# Patient Record
Sex: Female | Born: 2000 | Race: Black or African American | Hispanic: No | Marital: Single | Smoking: Current every day smoker
Health system: Southern US, Community
[De-identification: ages and names within clinical notes are randomized; demographics above are authoritative.]

## PROBLEM LIST (undated history)

## (undated) ENCOUNTER — Inpatient Hospital Stay: Payer: Self-pay

## (undated) DIAGNOSIS — Z789 Other specified health status: Secondary | ICD-10-CM

---
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
## 2019-04-18 ENCOUNTER — Other Ambulatory Visit: Payer: Self-pay

## 2019-04-18 ENCOUNTER — Encounter: Payer: Self-pay | Admitting: Emergency Medicine

## 2019-04-18 ENCOUNTER — Emergency Department: Payer: Self-pay

## 2019-04-18 ENCOUNTER — Emergency Department
Admission: EM | Admit: 2019-04-18 | Discharge: 2019-04-18 | Disposition: A | Discharge status: Home / Self Care | Payer: Self-pay | Attending: Emergency Medicine | Admitting: Emergency Medicine

## 2019-04-18 DIAGNOSIS — Y929 Unspecified place or not applicable: Secondary | ICD-10-CM | POA: Insufficient documentation

## 2019-04-18 DIAGNOSIS — S51832A Puncture wound without foreign body of left forearm, initial encounter: Secondary | ICD-10-CM | POA: Insufficient documentation

## 2019-04-18 DIAGNOSIS — Y939 Activity, unspecified: Secondary | ICD-10-CM | POA: Insufficient documentation

## 2019-04-18 DIAGNOSIS — Y999 Unspecified external cause status: Secondary | ICD-10-CM | POA: Insufficient documentation

## 2019-04-18 DIAGNOSIS — S59912A Unspecified injury of left forearm, initial encounter: Secondary | ICD-10-CM | POA: Insufficient documentation

## 2019-04-18 MED ORDER — BACITRACIN ZINC 500 UNIT/GM EX OINT
TOPICAL_OINTMENT | Freq: Once | CUTANEOUS | Status: AC
Start: 2019-04-18 — End: 2019-04-18
  Administered 2019-04-18: 22:00:00 via TOPICAL
  Filled 2019-04-18: qty 0.9

## 2019-04-18 MED ORDER — SULFAMETHOXAZOLE-TRIMETHOPRIM 800-160 MG PO TABS: 1.0000 | ORAL_TABLET | Freq: Two times a day (BID) | ORAL | 0 refills | Status: AC
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
## 2019-04-18 NOTE — ED Notes (Signed)
Left forearm cleansed with sterile water and bacitracin placed on would.  Sling to left arm.

## 2019-04-18 NOTE — Discharge Instructions (Addendum)
Please seek medical attention for any high fevers, chest pain, shortness of breath, change in behavior, persistent vomiting, bloody stool or any other new or concerning symptoms.  
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
## 2019-04-18 NOTE — ED Notes (Signed)
Patient no longer has a legal guardian as she is of age at this time.

## 2019-04-18 NOTE — ED Notes (Signed)
Pt sitting out front to get warm; ambulated with steady gait; talking in complete coherent sentences;

## 2021-03-03 ENCOUNTER — Encounter: Payer: Self-pay | Admitting: Advanced Practice Midwife

## 2021-03-03 ENCOUNTER — Other Ambulatory Visit: Payer: Self-pay

## 2021-03-03 ENCOUNTER — Ambulatory Visit: Payer: Self-pay | Admitting: Advanced Practice Midwife

## 2021-03-03 DIAGNOSIS — Z113 Encounter for screening for infections with a predominantly sexual mode of transmission: Secondary | ICD-10-CM

## 2021-03-03 DIAGNOSIS — F1290 Cannabis use, unspecified, uncomplicated: Secondary | ICD-10-CM

## 2021-03-03 DIAGNOSIS — B9689 Other specified bacterial agents as the cause of diseases classified elsewhere: Secondary | ICD-10-CM

## 2021-03-03 DIAGNOSIS — Z720 Tobacco use: Secondary | ICD-10-CM

## 2021-03-03 DIAGNOSIS — N760 Acute vaginitis: Secondary | ICD-10-CM

## 2021-03-03 LAB — WET PREP FOR TRICH, YEAST, CLUE
Trichomonas Exam: NEGATIVE
Yeast Exam: NEGATIVE

## 2021-03-03 MED ORDER — METRONIDAZOLE 500 MG PO TABS: 500.0000 mg | ORAL_TABLET | Freq: Two times a day (BID) | ORAL | 0 refills | Status: AC

## 2021-03-03 NOTE — Progress Notes (Signed)
Pt here for STD screening.  Wet mount results reviewed, medication dispensed per Provider orders.  Pt given condoms. Berdie Ogren, RN

## 2021-03-03 NOTE — Progress Notes (Signed)
[facility] Department STI clinic/screening visit  Subjective:  Brianna Ayala is a 20 y.o. SBF G2P2 vaper female being seen today for an STI screening visit. The patient reports they do have symptoms.  Patient reports that they do not desire a pregnancy in the next year.   They reported they are not interested in discussing contraception today.  No LMP recorded.   Patient has the following medical conditions:  There are no problems to display for this patient.   Chief Complaint  Patient presents with   SEXUALLY TRANSMITTED DISEASE    HPI  Patient reports external irritation since yesterday with red vagina last night. Poor historian and finally states she has a clear liquid coming out of 2 areas since yesterday (mons and left inguinal fold). Upon questioning, pt states her partner is asymptomatic.  Last sex 02/27/21 without condom; with current partner x5 mo. LMP 02/07/21. Last MJ last night. Last cigar 2 wks ago. Current vaper. Last ETOH 02/14/21 (2 glasses wine) qo weekend.  Last HIV test per patient/review of record was 2022 Patient reports last pap was never  See flowsheet for further details and programmatic requirements.    The following portions of the patient's history were reviewed and updated as appropriate: allergies, current medications, past medical history, past social history, past surgical history and problem list.  Objective:  There were no vitals filed for this visit.  Physical Exam Vitals and nursing note reviewed.  Constitutional:      Appearance: Normal appearance. She is normal weight.  HENT:     Head: Normocephalic and atraumatic.     Mouth/Throat:     Mouth: Mucous membranes are moist.     Pharynx: Oropharynx is clear. No oropharyngeal exudate or posterior oropharyngeal erythema.  Eyes:     Conjunctiva/sclera: Conjunctivae normal.  Pulmonary:     Effort: Pulmonary effort is normal.  Chest:  Breasts:    Right: No axillary adenopathy or  supraclavicular adenopathy.     Left: No axillary adenopathy or supraclavicular adenopathy.  Abdominal:     General: Abdomen is flat.     Palpations: Abdomen is soft. There is no mass.     Tenderness: There is no abdominal tenderness. There is no rebound.     Comments: Soft without masses or tenderness  Genitourinary:    Exam position: Lithotomy position.     Pubic Area: No rash or pubic lice.      Labia:        Right: No rash or lesion.        Left: Tenderness (area on left inguinal fold tender to touch with clear liquid) present. No rash or lesion.      Vagina: Vaginal discharge (white thick leukorrhea, ph>4.5) and erythema (sl erythematous vagina) present. No bleeding or lesions.     Cervix: Normal.     Uterus: Normal.      Adnexa: Right adnexa normal and left adnexa normal.     Rectum: Normal.       Comments: Left inguinal fold with clear moist skin greyish with breakdown; no distinct ulceration Right mons with clear moist skin with no distinct ulceration. Very tender to touch. Shoddy inguinal lymphadenopathy HSV cultures done of both and of cx Lymphadenopathy:     Head:     Right side of head: No preauricular or posterior auricular adenopathy.     Left side of head: No preauricular or posterior auricular adenopathy.     Cervical: No cervical adenopathy.  Right cervical: No superficial, deep or posterior cervical adenopathy.    Left cervical: No superficial, deep or posterior cervical adenopathy.     Upper Body:     Right upper body: No supraclavicular or axillary adenopathy.     Left upper body: No supraclavicular or axillary adenopathy.     Lower Body: Right inguinal adenopathy present. Left inguinal adenopathy present.  Skin:    General: Skin is warm and dry.     Findings: No rash.  Neurological:     Mental Status: She is alert and oriented to person, place, and time.     Assessment and Plan:  Brianna Ayala is a 20 y.o. female presenting to the [facility] Department for STI screening  1. Screening examination for venereal disease Treat wet mount per standing orders Immunization nurse consult - WET PREP FOR TRICH, YEAST, CLUE - Chlamydia/Gonorrhea [facility] - HIV/HCV [facility] - Syphilis Serology, [facility] - HSV Culture, No Typing     No follow-ups on file.  No future appointments.  Alberteen Spindle, CNM

## 2021-03-09 LAB — HM HIV SCREENING LAB: HM HIV Screening: NEGATIVE
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
## 2022-04-24 ENCOUNTER — Inpatient Hospital Stay
Admission: EM | Admit: 2022-04-24 | Discharge: 2022-04-26 | Disposition: A | Discharge status: Home / Self Care | Payer: MEDICAID | Admitting: Psychiatry

## 2022-04-24 DIAGNOSIS — R440 Auditory hallucinations: Secondary | ICD-10-CM

## 2022-04-24 DIAGNOSIS — F23 Brief psychotic disorder: Principal | ICD-10-CM

## 2022-04-24 LAB — CBC WITH AUTO DIFFERENTIAL
Absolute Immature Granulocyte: 0 10*3/uL (ref 0.00–0.04)
Basophils %: 0 % (ref 0–1)
Basophils Absolute: 0 10*3/uL (ref 0.0–0.1)
Eosinophils %: 2 % (ref 0–7)
Eosinophils Absolute: 0.1 10*3/uL (ref 0.0–0.4)
Hematocrit: 38.5 % (ref 35.0–47.0)
Hemoglobin: 12.9 g/dL (ref 11.5–16.0)
Immature Granulocytes: 0 % (ref 0.0–0.5)
Lymphocytes %: 39 % (ref 12–49)
Lymphocytes Absolute: 2 10*3/uL (ref 0.8–3.5)
MCH: 30.3 PG (ref 26.0–34.0)
MCHC: 33.5 g/dL (ref 30.0–36.5)
MCV: 90.4 FL (ref 80.0–99.0)
MPV: 9.4 FL (ref 8.9–12.9)
Monocytes %: 9 % (ref 5–13)
Monocytes Absolute: 0.5 10*3/uL (ref 0.0–1.0)
Neutrophils %: 50 % (ref 32–75)
Neutrophils Absolute: 2.5 10*3/uL (ref 1.8–8.0)
Nucleated RBCs: 0 PER 100 WBC
Platelets: 255 10*3/uL (ref 150–400)
RBC: 4.26 M/uL (ref 3.80–5.20)
RDW: 13.1 % (ref 11.5–14.5)
WBC: 5.1 10*3/uL (ref 3.6–11.0)
nRBC: 0 10*3/uL (ref 0.00–0.01)

## 2022-04-24 LAB — URINE DRUG SCREEN
Amphetamine, Urine: NEGATIVE
Barbiturates, Urine: NEGATIVE
Benzodiazepines, Urine: NEGATIVE
Cocaine, Urine: NEGATIVE
Methadone, Urine: NEGATIVE
Opiates, Urine: NEGATIVE
PCP, Urine: NEGATIVE
THC, TH-Cannabinol, Urine: POSITIVE — AB

## 2022-04-24 LAB — COVID-19 & INFLUENZA COMBO
Rapid Influenza A By PCR: NOT DETECTED
Rapid Influenza B By PCR: NOT DETECTED
SARS-CoV-2, PCR: NOT DETECTED

## 2022-04-24 LAB — COMPREHENSIVE METABOLIC PANEL
ALT: 12 U/L (ref 12–78)
AST: 13 U/L — ABNORMAL LOW (ref 15–37)
Albumin/Globulin Ratio: 0.9 — ABNORMAL LOW (ref 1.1–2.2)
Albumin: 3.7 g/dL (ref 3.5–5.0)
Alk Phosphatase: 67 U/L (ref 45–117)
Anion Gap: 8 mmol/L (ref 5–15)
BUN: 13 MG/DL (ref 6–20)
Bun/Cre Ratio: 16 (ref 12–20)
CO2: 26 mmol/L (ref 21–32)
Calcium: 9.1 MG/DL (ref 8.5–10.1)
Chloride: 105 mmol/L (ref 97–108)
Creatinine: 0.82 MG/DL (ref 0.55–1.02)
Est, Glom Filt Rate: >60 mL/min/{1.73_m2} (ref >60)
Globulin: 4 g/dL (ref 2.0–4.0)
Glucose: 83 mg/dL (ref 65–100)
Potassium: 3.6 mmol/L (ref 3.5–5.1)
Sodium: 139 mmol/L (ref 136–145)
Total Bilirubin: 0.9 MG/DL (ref 0.2–1.0)
Total Protein: 7.7 g/dL (ref 6.4–8.2)

## 2022-04-24 LAB — URINALYSIS WITH MICROSCOPIC
Bilirubin Urine: NEGATIVE
Glucose, UA: NEGATIVE mg/dL
Ketones, Urine: NEGATIVE mg/dL
Nitrite, Urine: POSITIVE — AB
Protein, UA: 30 mg/dL — AB
Specific Gravity, UA: 1.025
Urobilinogen, Urine: 1 EU/dL (ref 0.2–1.0)
pH, Urine: 6 (ref 5.0–8.0)

## 2022-04-24 LAB — ETHANOL: Ethanol Lvl: <10 MG/DL (ref <10)

## 2022-04-24 LAB — POC PREGNANCY UR-QUAL: Preg Test, Ur: NEGATIVE

## 2022-04-24 MED ORDER — LORAZEPAM 2 MG/ML IJ SOLN
2 | Freq: Four times a day (QID) | INTRAMUSCULAR | Status: DC | PRN
Start: 2022-04-24 — End: 2022-04-24

## 2022-04-24 MED ORDER — TRAZODONE HCL 50 MG PO TABS
50 MG | Freq: Every evening | ORAL | Status: AC | PRN
Start: 2022-04-24 — End: ?

## 2022-04-24 MED ORDER — ALUM & MAG HYDROXIDE-SIMETH 200-200-20 MG/5ML PO SUSP
200-200-20 MG/5ML | Freq: Four times a day (QID) | ORAL | Status: AC | PRN
Start: 2022-04-24 — End: ?

## 2022-04-24 MED ORDER — DIPHENHYDRAMINE HCL 50 MG/ML IJ SOLN
50 MG/ML | INTRAMUSCULAR | Status: AC | PRN
Start: 2022-04-24 — End: ?

## 2022-04-24 MED ORDER — NICOTINE 21 MG/24HR TD PT24
21 MG/24HR | Freq: Every day | TRANSDERMAL | Status: AC | PRN
Start: 2022-04-24 — End: ?

## 2022-04-24 MED ORDER — POLYETHYLENE GLYCOL 3350 17 G PO PACK
17 g | Freq: Every day | ORAL | Status: AC | PRN
Start: 2022-04-24 — End: ?

## 2022-04-24 MED ORDER — HALOPERIDOL LACTATE 5 MG/ML IJ SOLN
5 MG/ML | INTRAMUSCULAR | Status: AC | PRN
Start: 2022-04-24 — End: ?

## 2022-04-24 MED ORDER — LORAZEPAM 2 MG/ML IJ SOLN
2 | Freq: Four times a day (QID) | INTRAMUSCULAR | Status: DC | PRN
Start: 2022-04-24 — End: 2022-04-26

## 2022-04-24 MED ORDER — ACETAMINOPHEN 325 MG PO TABS
325 | ORAL | Status: DC | PRN
Start: 2022-04-24 — End: 2022-04-26

## 2022-04-24 MED ORDER — HYDROXYZINE HCL 25 MG PO TABS
25 MG | Freq: Three times a day (TID) | ORAL | Status: AC | PRN
Start: 2022-04-24 — End: ?

## 2022-04-24 MED ORDER — HALOPERIDOL 5 MG PO TABS
5 MG | ORAL | Status: AC | PRN
Start: 2022-04-24 — End: ?

## 2022-04-24 MED ORDER — CEPHALEXIN 500 MG PO CAPS
500 MG | Freq: Once | ORAL | Status: AC
Start: 2022-04-24 — End: 2022-04-24
  Administered 2022-04-24: 19:00:00 500 mg via ORAL

## 2022-04-24 MED ORDER — NICOTINE 21 MG/24HR TD PT24
21 MG/24HR | Freq: Every day | TRANSDERMAL | Status: DC
Start: 2022-04-24 — End: 2022-04-24

## 2022-04-24 MED ORDER — STERILE WATER FOR INJECTION (MIXTURES ONLY)
10 | Freq: Once | INTRAMUSCULAR | Status: AC
Start: 2022-04-24 — End: 2022-04-24
  Administered 2022-04-24: 16:00:00 5 mg via INTRAMUSCULAR

## 2022-04-24 MED FILL — OLANZAPINE 10 MG IM SOLR: 10 MG | INTRAMUSCULAR | Qty: 10

## 2022-04-24 MED FILL — CEPHALEXIN 500 MG PO CAPS: 500 MG | ORAL | Qty: 1
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
## 2022-04-24 NOTE — Plan of Care (Signed)
Problem: Behavior  Goal: Pt/Family maintain appropriate behavior and adhere to behavioral management agreement, if implemented  Description: INTERVENTIONS:  1. Assess patient/family's coping skills and  non-compliant behavior (including use of illegal substances)  2. Notify security of behavior or suspected illegal substances which indicate the need for search of the family and/or belongings  3. Encourage verbalization of thoughts and concerns in a socially appropriate manner  4. Utilize positive, consistent limit setting strategies supporting safety of patient, staff and others  5. Encourage participation in the decision making process about the behavioral management agreement  6. If a visitor's behavior poses a threat to safety call refer to organization policy.  7. Initiate consult with Social Worker, Psychosocial CNS, Spiritual Care as appropriate  Outcome: Progressing
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
## 2022-04-24 NOTE — ED Notes (Signed)
TRANSFER - OUT REPORT:    Verbal report given to Abigail on Susan Young being transferred to [facility] for routine progression of patient care       Report consisted of patient's Situation, Background, Assessment and   Recommendations(SBAR).     Information from the following report(s) Nurse Handoff Report was reviewed with the receiving nurse.    Kinder Fall Assessment:    Presents to emergency department  because of falls (Syncope, seizure, or loss of consciousness): No  Age > 70: No  Altered Mental Status, Intoxication with alcohol or substance confusion (Disorientation, impaired judgment, poor safety awaremess, or inability to follow instructions): No  Impaired Mobility: Ambulates or transfers with assistive devices or assistance; Unable to ambulate or transer.: No  Nursing Judgement: No          Lines:       Opportunity for questions and clarification was provided.      Patient transported with: belongings          Troy Sine, RN  04/24/22 1551

## 2022-04-24 NOTE — Group Note (Signed)
Group Therapy Note    Date: 04/24/2022    Group Start Time: 1400  Group End Time: 1500  Group Topic: Recreational    [facility]    Caroly Purewal S Faiza Bansal        Group Therapy Note    Attendees:        Patient's Goal:  To concentrate on selected task    Notes:  Pt did not attend session    Discipline Responsible: Recreational Therapist      Signature:  Annalicia Renfrew S Lulla [place]

## 2022-04-24 NOTE — Progress Notes (Signed)
Pt was noted isolative to self. She presented alert and oriented x3, sad affect, mood depressed. Pt denied SI/HI/VH. She endorsed auditory hallucinations. Pt reported that she does not understand why she was admitted to the hospital. She reported that she wanted to talk to someone outpatient. She reported that she is not suicidal and has supportive parents with whom she lives. TDO process was explained to her and she verbalized understanding. She voiced no other concerns. She accepted snack and fluids.    Pt appeared to have slept approx 7.5 hours during the night. No acute distress observed. Monitoring for safety and behaviors continues.

## 2022-04-24 NOTE — ED Notes (Signed)
RN attempted to have patient obtain urine sample. Pt still reports not needing to use the restroom. RN provided patient water and educated patient on need for urine sample.      [place], RN  04/24/22 1318
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
## 2022-04-25 DIAGNOSIS — F23 Brief psychotic disorder: Secondary | ICD-10-CM

## 2022-04-25 MED ORDER — NITROFURANTOIN MONOHYD MACRO 100 MG PO CAPS
100 | Freq: Two times a day (BID) | ORAL | Status: DC
Start: 2022-04-25 — End: 2022-04-26

## 2022-04-25 NOTE — Group Note (Signed)
Group Therapy Note    Date: 04/25/2022    Group Start Time: 1400  Group End Time: 1500  Group Topic: Recreational    [facility]    Khloe Hunkele S Yonathan Perrow        Group Therapy Note    Attendees:        Patient's Goal:  To concentrate on selected task    Notes:  Pt did not attend session    Discipline Responsible: Recreational Therapist      Signature:  Kaisa Wofford S Gay Moncivais
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
## 2022-04-25 NOTE — Group Note (Signed)
Group Therapy Note    Date: 04/25/2022    Group Start Time: 1100  Group End Time: 1200  Group Topic: Topic Group    [facility] AC[facility]    Susan Young S Susan Young        Group Therapy Note    Attendees:        Patient's Goal:  To participate in choices in recovery game    Notes:  Pt did not attend session    Discipline Responsible: Recreational Therapist      Signature:  Nijel Flink S Aiya Keach

## 2022-04-25 NOTE — H&P (Signed)
INITIAL PSYCHIATRIC EVALUATION            IDENTIFICATION:    Patient Name  Susan Young   Date of Birth April 17, 2001   CSN [id number]   Medical Record Number  [id number]      Age  21 y.o.   PCP No primary care provider on file.   Admit date:  04/24/2022    Room Number  308/01  @ [facility]   Date of Service  04/25/2022            HISTORY         REASON FOR HOSPITALIZATION:  CC: "psychosis". Pt admitted under a temporary detention order (TDO) for severe psychosis proving to be an imminent danger to self and an inability to care for self.    HISTORY OF PRESENT ILLNESS:    The patient, Susan Young, is a 21 y.o.  Black / [place] American female with no significant past psychiatric history, who presents at this time with complaints of (and/or evidence of) the following emotional symptoms: delusions.  Additional symptomatology include auditory hallucinations.  The above symptoms have been present for 2+ weeks. These symptoms are of moderate to high severity. These symptoms are constant in nature.  The patient's condition has been precipitated by psychosocial stressors.  Patient's condition made worse by continued psychoactive drug use. UDS: +THC; BAL=0.    The patient was brought into the hospital after an episode of agitation and disorganized behavior. She was noted to be actively hallucinating, stating she was hearing the voice of her recently deceased uncle. Patient reported visual hallucinations of her uncle following her around, and stated she saw her uncle in the ambulance.     The patient is a fair historian. The patient corroborates the above narrative. The patient contracts for safety on the unit and gives consent for the team to contact collateral. The patient is amenable to initiating treatment while on the unit. She reports no prior episodes of psychosis or AVH and no previous hospitalizations.     ALLERGIES:  No Known Allergies     MEDICATIONS PRIOR TO ADMISSION:   No current  facility-administered medications on file prior to encounter.     Current Outpatient Medications on File Prior to Encounter   Medication Sig Dispense Refill    loratadine (CLARITIN) 10 MG tablet Take 1 tablet by mouth daily as needed (allergies)           PAST MEDICAL HISTORY:   No past medical history on file.No past surgical history on file.     SOCIAL HISTORY:    The patient is currently unemployed; the patient Fraser Smoking Status: smokes less than a pack per day; the patient's marital status is  separated; the patient has custodial children under 77; the patient's highest level of education is high school or equivalent. She lives with her two children.     FAMILY HISTORY: History reviewed, pertinent family history as below:   No family history on file.      REVIEW OF SYSTEMS:   Pertinent items are noted in the History of Present Illness.  All other Systems reviewed and are considered negative.         MENTAL STATUS EXAM & VITALS     MENTAL STATUS EXAM (MSE):    MSE findings are within normal limits (WNL) unless otherwise stated below. (all of the below categories of the MSE have been reviewed (reviewed 04/25/2022) and updated as deemed appropriate)  General Presentation age appropriate and thin & gaunt looking, cooperative   Orientation oriented to time, place and person   Language No aphasia or dysarthria   Speech normal volume   Thought Processes normal rate of thoughts, fair abstract reasoning/computation   Thought Content auditory hallucinations and visual hallucinations   Suicidal Ideations contracts for safety   Homicidal Ideations none   Mood:  sad   Affect:  Labile   Memory recent  good   Concentration/Attention:  intact   Fund of Knowledge average   Insight and Judgment: limited        VITALS:     Vitals:    04/25/22 0844   BP: 104/75   Pulse: 54   Resp: 18   Temp: 97.9 F (36.6 C)   SpO2: 100%           DATA     LABORATORY DATA:    Admission on 04/24/2022   Component Date Value Ref Range Status     SARS-CoV-2, PCR 04/24/2022 Not detected  NOTD   Final    Rapid Influenza A By PCR 04/24/2022 Not detected    Final    Rapid Influenza B By PCR 04/24/2022 Not detected    Final    WBC 04/24/2022 5.1  3.6 - 11.0 K/uL Final    RBC 04/24/2022 4.26  3.80 - 5.20 M/uL Final    Hemoglobin 04/24/2022 12.9  11.5 - 16.0 g/dL Final    Hematocrit 04/24/2022 38.5  35.0 - 47.0 % Final    MCV 04/24/2022 90.4  80.0 - 99.0 FL Final    MCH 04/24/2022 30.3  26.0 - 34.0 PG Final    MCHC 04/24/2022 33.5  30.0 - 36.5 g/dL Final    RDW 04/24/2022 13.1  11.5 - 14.5 % Final    Platelets 04/24/2022 255  150 - 400 K/uL Final    MPV 04/24/2022 9.4  8.9 - 12.9 FL Final    Nucleated RBCs 04/24/2022 0.0  0 PER 100 WBC Final    nRBC 04/24/2022 0.00  0.00 - 0.01 K/uL Final    Neutrophils % 04/24/2022 50  32 - 75 % Final    Lymphocytes % 04/24/2022 39  12 - 49 % Final    Monocytes % 04/24/2022 9  5 - 13 % Final    Eosinophils % 04/24/2022 2  0 - 7 % Final    Basophils % 04/24/2022 0  0 - 1 % Final    Immature Granulocytes 04/24/2022 0  0.0 - 0.5 % Final    Neutrophils Absolute 04/24/2022 2.5  1.8 - 8.0 K/UL Final    Lymphocytes Absolute 04/24/2022 2.0  0.8 - 3.5 K/UL Final    Monocytes Absolute 04/24/2022 0.5  0.0 - 1.0 K/UL Final    Eosinophils Absolute 04/24/2022 0.1  0.0 - 0.4 K/UL Final    Basophils Absolute 04/24/2022 0.0  0.0 - 0.1 K/UL Final    Absolute Immature Granulocyte 04/24/2022 0.0  0.00 - 0.04 K/UL Final    Differential Type 04/24/2022 AUTOMATED    Final    Sodium 04/24/2022 139  136 - 145 mmol/L Final    Potassium 04/24/2022 3.6  3.5 - 5.1 mmol/L Final    Chloride 04/24/2022 105  97 - 108 mmol/L Final    CO2 04/24/2022 26  21 - 32 mmol/L Final    Anion Gap 04/24/2022 8  5 - 15 mmol/L Final    Glucose 04/24/2022 83  65 - 100 mg/dL Final  BUN 04/24/2022 13  6 - 20 MG/DL Final    Creatinine 04/24/2022 0.82  0.55 - 1.02 MG/DL Final    Bun/Cre Ratio 04/24/2022 16  12 - 20   Final    Est, Glom Filt Rate 04/24/2022 >60  >60 ml/min/1.86m  Final    Calcium 04/24/2022 9.1  8.5 - 10.1 MG/DL Final    Total Bilirubin 04/24/2022 0.9  0.2 - 1.0 MG/DL Final    ALT 04/24/2022 12  12 - 78 U/L Final    AST 04/24/2022 13 (L)  15 - 37 U/L Final    Alk Phosphatase 04/24/2022 67  45 - 117 U/L Final    Total Protein 04/24/2022 7.7  6.4 - 8.2 g/dL Final    Albumin 04/24/2022 3.7  3.5 - 5.0 g/dL Final    Globulin 04/24/2022 4.0  2.0 - 4.0 g/dL Final    Albumin/Globulin Ratio 04/24/2022 0.9 (L)  1.1 - 2.2   Final    Ethanol Lvl 04/24/2022 <10  <10 MG/DL Final    Color, UA 04/24/2022 DARK YELLOW    Final    Appearance 04/24/2022 TURBID (A)  CLEAR   Final    Specific Gravity, UA 04/24/2022 1.025    Final    pH, Urine 04/24/2022 6.0  5.0 - 8.0   Final    Protein, UA 04/24/2022 30 (A)  NEG mg/dL Final    Glucose, UA 04/24/2022 Negative  NEG mg/dL Final    Ketones, Urine 04/24/2022 Negative  NEG mg/dL Final    Bilirubin Urine 04/24/2022 Negative  NEG   Final    Blood, Urine 04/24/2022 TRACE (A)  NEG   Final    Urobilinogen, Urine 04/24/2022 1.0  0.2 - 1.0 EU/dL Final    Nitrite, Urine 04/24/2022 Positive (A)  NEG   Final    Leukocyte Esterase, Urine 04/24/2022 LARGE (A)  NEG   Final    WBC, UA 04/24/2022 20-50  0 - 4 /hpf Final    RBC, UA 04/24/2022 0-5  0 - 5 /hpf Final    Epithelial Cells UA 04/24/2022 MODERATE (A)  FEW /lpf Final    BACTERIA, URINE 04/24/2022 2+ (A)  NEG /hpf Final    Amphetamine, Urine 04/24/2022 Negative  NEG   Final    Barbiturates, Urine 04/24/2022 Negative  NEG   Final    Benzodiazepines, Urine 04/24/2022 Negative  NEG   Final    Cocaine, Urine 04/24/2022 Negative  NEG   Final    Methadone, Urine 04/24/2022 Negative  NEG   Final    Opiates, Urine 04/24/2022 Negative  NEG   Final    PCP, Urine 04/24/2022 Negative  NEG   Final    THC, TH-Cannabinol, Urine 04/24/2022 Positive (A)  NEG   Final    Comments: 04/24/2022 (NOTE)   Final    Preg Test, Ur 04/24/2022 Negative  NEG   Final          MEDICATIONS     SCHEDULED MEDICATIONS            ASSESSMENT &  PLAN     The patient, Susan Young is a 21y.o.  female who presents at this time for treatment of the following diagnoses:  Brief psychotic disorder ([place]  ASSESSMENT: the patient presents with mood lability and hallucinations in the setting of bereavement and THC use. She has good insight into her situation and is less likely to be experiencing a first break. Treatment will focus on setting up resources for  outpatient therapy. Will observe for now, work on Land.  PLAN:  - Observe off substances  - Consider antipsychotic if symptoms recur  - Tx for UTI (prescription to be sent on DC)  - IGM therapy as tolerated  - Expand database / obtain collateral  - Dispo planning (home when stable)       I will obtain labwork for all medications for which routine monitoring is recommended and agents that require intense monitoring for toxicity as deemed appropriate based on current medication side effects and pharmacodynamically determined drug 1/2 lives.    A coordinated, multidisplinary treatment team (includes the nurse, unit pharmacist, Catering manager) round was conducted for this initial evaluation with the patient present.     The following regarding medications was addressed during rounds with patient: the risks and benefits of the proposed medication. The patient was given the opportunity to ask questions. Informed consent given to the use of the above medications.    I will continue to adjust psychiatric and non-psychiatric medications (see above "medication" section and orders section for details) as deemed appropriate & based upon diagnoses and response to treatment. I have reviewed admission (and previous/old) labs and medical tests in the EHR and or transferring hospital documents. I will continue to order blood tests/labs and diagnostic tests as deemed appropriate and review results as they become available (see orders for details). I have reviewed old psychiatric and medical records  available in the EHR. I Will order additional psychiatric records from other institutions to further elucidate the nature of patient's psychopathology and review once available.    I will gather additional collateral information from friends, family and o/p treatment team to further elucidate the nature of patient's psychopathology and baselline level of psychiatric functioning.    I certify that this patient's inpatient psychiatric hospital services are required for treatment that could reasonably be expected to improve the patient's condition, or for diagnostic study, and that the patient continues to need, on a daily basis, active treatment furnished directly by or requiring the supervision of inpatient psychiatric facility personnel. In addition, the hospital records show that services furnished were intensive treatment services, admission or related services, or equivalent services.      ESTIMATED LENGTH OF STAY:    2-3 days       STRENGTHS:  Exercising self-direction/Resourceful, Access to housing/residential stability, and Interpersonal/supportive relationships (family, friends, peers)                                    SIGNED:    Raeford Razor, MD  04/25/2022

## 2022-04-25 NOTE — Plan of Care (Signed)
Problem: Pain  Goal: Verbalizes/displays adequate comfort level or baseline comfort level  Outcome: Progressing     Problem: Risk for Elopement  Goal: Patient will not exit the unit/facility without proper excort  Outcome: Progressing     Problem: Anxiety  Goal: Will report anxiety at manageable levels  Description: INTERVENTIONS:  1. Administer medication as ordered  2. Teach and rehearse alternative coping skills  3. Provide emotional support with 1:1 interaction with staff  Outcome: Progressing

## 2022-04-25 NOTE — Behavioral Health Treatment Team (Signed)
SW contacted Jerilynn Som, pt boyfriend [phone]) in order to obtain pt aunt phone number. Pt boyfriend does not seem like reliable historian to event and is minimizing pt current mental health needs.       Maurine Cane, social work Consulting civil engineer

## 2022-04-25 NOTE — Progress Notes (Signed)
[facility] Admission Pharmacy Medication Reconciliation    Information obtained from: Patient interview  RxQuery data available1:No    Comments/recommendations:  Denies taking any prescription medications.  Reports only taking OTC loratadine as needed for allergies.   The [place] Prescription Monitoring Program (PMP) was accessed to determine fill history of any controlled medications. No controlled medications filled within the past 2 years.    Medication changes (since last review):  Added  Loratadine       1RxQuery pharmacy benefit data reflects medications filled and processed through the patient's insurance, however                this data does NOT capture whether the medication was picked up or is currently being taken by the patient.         Patient allergies:   Allergies as of 04/24/2022    (No Known Allergies)         Prior to Admission Medications   Prescriptions Last Dose Informant Patient Reported? Taking?   loratadine (CLARITIN) 10 MG tablet   Yes Yes   Sig: Take 1 tablet by mouth daily as needed (allergies)      Facility-Administered Medications: None          Thank you,  Sandi Mariscal, PharmD, BCPS, [facility]  Clinical Pharmacy Specialist, Behavioral Health  Desk: [phone])  Pharmacy: [phone])

## 2022-04-25 NOTE — Behavioral Health Treatment Team (Cosign Needed)
SW contacted pt aunt Adair Laundry [phone]). Pt aunt reports that pt children are safe with her. Pt aunt is considering ECO for children because she is concerned about pt current presentation.       Maurine Cane, social work Consulting civil engineer

## 2022-04-25 NOTE — Progress Notes (Signed)
Morning assessment complete. Patient was encountered sitting in the hall.  Patient was calm and cooperative with assessment.  Eye contact is noted to be fair.  Patient appears sad and depressed.  Mood is noted to be sad with a flat affect.  Patient currently reports that she feels depressed, did not rate level.  There are no signs or symptoms of anxiety, also denies all SI/HI, endorses AH of hearing her deceased uncle's voice.  Patient has been out in the milieu. She is in the day room playing games with the other patients.   Patient is both med and meal compliant. There are no untoward side effects from medications to note.  Hourly rounds are being completed to assure patient safety and attend to care needs. Will continue to monitor for safety.

## 2022-04-26 MED ORDER — NITROFURANTOIN MONOHYD MACRO 100 MG PO CAPS
100 MG | ORAL_CAPSULE | Freq: Two times a day (BID) | ORAL | 0 refills | Status: AC
Start: 2022-04-26 — End: 2022-05-01

## 2022-04-26 NOTE — Behavioral Health Treatment Team (Addendum)
Nurses Note;      1900 to 0700:      Report received from outgoing day shift RN. Assumed care of patient. On initial round Patient is in  dayroom, up., alert, oriented, sitting with peer and listening to music. Patient  denies S/I, H/I, A/V/H, pain, depression, anxiety. Patient  affect bright, mood calm and cooperative. Patient has no    HS medications scheduled. Patient received no  PRN.Patient observed resting in bed during shift rounds. Continuously hourly rounds and q 15 minute checks maintained during shift for care and safety. Patient slept for 6.15 hrs.

## 2022-04-26 NOTE — Group Note (Signed)
Group Therapy Note    Date: 04/26/2022    Group Start Time: 1400  Group End Time: 1500  Group Topic: Recreational    [facility] ACUTE [facility]    Susan Young S Susan Young        Group Therapy Note    Attendees:        Patient's Goal:  To concentrate on selected task      Status After Intervention:  Improved    Participation Level: Sports coach and Interactive    Participation Quality: Appropriate, Attentive, and Sharing      Speech:  normal      Thought Process/Content: Logical      Affective Functioning: Congruent      Mood: euthymic      Level of consciousness:  Alert, Oriented x4, and Attentive      Response to Learning: Progressing to goal      Endings: None Reported    Modes of Intervention: Socialization and Activity      Discipline Responsible: Recreational Therapist      Signature:  Uday Jantz S Jeovany Huitron

## 2022-04-26 NOTE — Behavioral Health Treatment Team (Signed)
1615 At this time, discharge instructions provided to patient, denied any question and verbalized understanding. Patient denied SI, HI, A/V hallucinations, anxiety and depression at this time. No complain of pain. Patient left in stable condition accompanied by her boyfriend.

## 2022-04-26 NOTE — Group Note (Signed)
Group Therapy Note    Date: 04/26/2022    Group Start Time: 1000  Group End Time: 1100  Group Topic: Topic Group    [facility] AC[facility]    Carden Teel S Danely Bayliss        Group Therapy Note    Attendees:        Patient's Goal:  To identify positive coping strategies      Status After Intervention:  Improved    Participation Level: Sports coach and Interactive    Participation Quality: Appropriate, Attentive, and Sharing      Speech:  normal      Thought Process/Content: Logical      Affective Functioning: Congruent      Mood: euthymic      Level of consciousness:  Alert, Oriented x4, and Attentive      Response to Learning: Progressing to goal      Endings: None Reported    Modes of Intervention: Education      Discipline Responsible: Recreational Therapist      Signature:  Latham Kinzler S Keidan Aumiller

## 2022-04-26 NOTE — Plan of Care (Signed)
0900 Patient was met in day-room, cooperative with the assessments and vital signs. Patient confirmed anxiety, denied depression, SI, HI, A/V hallucinations. Patient was medication and meal compliant. Vital signs within normal parameters. Will continue to monitor.    Problem: Anxiety  Goal: Will report anxiety at manageable levels  Description: INTERVENTIONS:  1. Administer medication as ordered  2. Teach and rehearse alternative coping skills  3. Provide emotional support with 1:1 interaction with staff  Outcome: Progressing

## 2022-04-26 NOTE — Discharge Summary (Signed)
PSYCHIATRIC DISCHARGE SUMMARY         IDENTIFICATION:    Patient Name  Susan Young   Date of Birth [birth year]   CSN [id number]   Medical Record Number  [id number]      Age  21 y.o.   PCP No primary care provider on file.   Admit date:  04/24/2022    Discharge date: 04/26/2022   Room Number  323/02  @ [facility]   Date of Service  04/26/2022            TYPE OF DISCHARGE: REGULAR               CONDITION AT DISCHARGE: Improved and Fair       PROVISIONAL & DISCHARGE DIAGNOSES:        Active Hospital Problems    *Brief psychotic disorder ([place])      Bereavement reaction        DISCHARGE DIAGNOSIS:   Axis I:  SEE ABOVE  Axis II: SEE ABOVE  Axis III: SEE ABOVE  Axis IV:  lack of structure  Axis V:  20 on admission, 70 on discharge     HISTORY OF PRESENT ILLNESS:  "Psychosis"    The patient, Susan Young, is a 21 y.o.  Black / [place] American female with no significant past psychiatric history, who presents at this time with complaints of (and/or evidence of) the following emotional symptoms: delusions.  Additional symptomatology include auditory hallucinations.  The above symptoms have been present for 2+ weeks. These symptoms are of moderate to high severity. These symptoms are constant in nature.  The patient's condition has been precipitated by psychosocial stressors.  Patient's condition made worse by continued psychoactive drug use. UDS: +THC; BAL=0.     The patient was brought into the hospital after an episode of agitation and disorganized behavior. She was noted to be actively hallucinating, stating she was hearing the voice of her recently deceased uncle. Patient reported visual hallucinations of her uncle following her around, and stated she saw her uncle in the ambulance.      The patient is a fair historian. The patient corroborates the above narrative. The patient contracts for safety on the unit and gives consent for the team to contact collateral. The patient is amenable to initiating  treatment while on the unit. She reports no prior episodes of psychosis or AVH and no previous hospitalizations.        HOSPITALIZATION COURSE:    Susan Young was admitted to the inpatient psychiatric unit [facility] for acute psychiatric stabilization in regards to symptomatology as described in the HPI above. The differential diagnosis at time of admission included: schizophrenia vs schizoaffective disorder.  While on the unit Susan Young was involved in individual, group, occupational and milieu therapy.  Psychiatric medications were not adjusted during this hospitalization.   Susan Young demonstrated a slow, but progressive improvement in overall condition.  Much of patient's initial presentation appeared to be related to situational stressors, drugs of abuse, and psychological factors.  Please see individual progress notes for more specific details regarding patient's hospitalization course.     At time of discharge, Susan Young is without significant problems of depression, psychosis, or mania. Patient free of suicidal and homicidal ideations (appears to be at very low risk of suicide or homicide) and reports many positive predictive factors in terms of not attempting suicide or homicide. Overall presentation at time of discharge is most  consistent with the diagnosis of brief psychotic episode.    Patient has maximized benefit to be derived from acute inpatient psychiatric treatment.  All members of the treatment team concur with each other in regards to plans for discharge today. Patient and family are aware and in agreement with discharge and discharge plan.         LABS AND IMAGAING:    Labs Reviewed   COMPREHENSIVE METABOLIC PANEL - Abnormal; Notable for the following components:       Result Value    AST 13 (*)     Albumin/Globulin Ratio 0.9 (*)     All other components within normal limits   URINALYSIS WITH MICROSCOPIC - Abnormal; Notable for the following components:     Appearance TURBID (*)     Protein, UA 30 (*)     Blood, Urine TRACE (*)     Nitrite, Urine Positive (*)     Leukocyte Esterase, Urine LARGE (*)     Epithelial Cells UA MODERATE (*)     BACTERIA, URINE 2+ (*)     All other components within normal limits   URINE DRUG SCREEN - Abnormal; Notable for the following components:    THC, TH-Cannabinol, Urine Positive (*)     All other components within normal limits   COVID-19 & INFLUENZA COMBO   CBC WITH AUTO DIFFERENTIAL   ETHANOL   POC PREGNANCY UR-QUAL   POC PREGNANCY UR-QUAL     No results found for: "PHEN", "PHENO", "PHENY", "PTN", "VALAC", "VALP", "CARB2"  Admission on 04/24/2022, Discharged on 04/26/2022   Component Date Value Ref Range Status    SARS-CoV-2, PCR 04/24/2022 Not detected  NOTD   Final    Rapid Influenza A By PCR 04/24/2022 Not detected    Final    Rapid Influenza B By PCR 04/24/2022 Not detected    Final    WBC 04/24/2022 5.1  3.6 - 11.0 K/uL Final    RBC 04/24/2022 4.26  3.80 - 5.20 M/uL Final    Hemoglobin 04/24/2022 12.9  11.5 - 16.0 g/dL Final    Hematocrit 04/24/2022 38.5  35.0 - 47.0 % Final    MCV 04/24/2022 90.4  80.0 - 99.0 FL Final    MCH 04/24/2022 30.3  26.0 - 34.0 PG Final    MCHC 04/24/2022 33.5  30.0 - 36.5 g/dL Final    RDW 04/24/2022 13.1  11.5 - 14.5 % Final    Platelets 04/24/2022 255  150 - 400 K/uL Final    MPV 04/24/2022 9.4  8.9 - 12.9 FL Final    Nucleated RBCs 04/24/2022 0.0  0 PER 100 WBC Final    nRBC 04/24/2022 0.00  0.00 - 0.01 K/uL Final    Neutrophils % 04/24/2022 50  32 - 75 % Final    Lymphocytes % 04/24/2022 39  12 - 49 % Final    Monocytes % 04/24/2022 9  5 - 13 % Final    Eosinophils % 04/24/2022 2  0 - 7 % Final    Basophils % 04/24/2022 0  0 - 1 % Final    Immature Granulocytes 04/24/2022 0  0.0 - 0.5 % Final    Neutrophils Absolute 04/24/2022 2.5  1.8 - 8.0 K/UL Final    Lymphocytes Absolute 04/24/2022 2.0  0.8 - 3.5 K/UL Final    Monocytes Absolute 04/24/2022 0.5  0.0 - 1.0 K/UL Final    Eosinophils Absolute  04/24/2022 0.1  0.0 - 0.4 K/UL Final  Basophils Absolute 04/24/2022 0.0  0.0 - 0.1 K/UL Final    Absolute Immature Granulocyte 04/24/2022 0.0  0.00 - 0.04 K/UL Final    Differential Type 04/24/2022 AUTOMATED    Final    Sodium 04/24/2022 139  136 - 145 mmol/L Final    Potassium 04/24/2022 3.6  3.5 - 5.1 mmol/L Final    Chloride 04/24/2022 105  97 - 108 mmol/L Final    CO2 04/24/2022 26  21 - 32 mmol/L Final    Anion Gap 04/24/2022 8  5 - 15 mmol/L Final    Glucose 04/24/2022 83  65 - 100 mg/dL Final    BUN 04/24/2022 13  6 - 20 MG/DL Final    Creatinine 04/24/2022 0.82  0.55 - 1.02 MG/DL Final    Bun/Cre Ratio 04/24/2022 16  12 - 20   Final    Est, Glom Filt Rate 04/24/2022 >60  >60 ml/min/1.5m Final    Calcium 04/24/2022 9.1  8.5 - 10.1 MG/DL Final    Total Bilirubin 04/24/2022 0.9  0.2 - 1.0 MG/DL Final    ALT 04/24/2022 12  12 - 78 U/L Final    AST 04/24/2022 13 (L)  15 - 37 U/L Final    Alk Phosphatase 04/24/2022 67  45 - 117 U/L Final    Total Protein 04/24/2022 7.7  6.4 - 8.2 g/dL Final    Albumin 04/24/2022 3.7  3.5 - 5.0 g/dL Final    Globulin 04/24/2022 4.0  2.0 - 4.0 g/dL Final    Albumin/Globulin Ratio 04/24/2022 0.9 (L)  1.1 - 2.2   Final    Ethanol Lvl 04/24/2022 <10  <10 MG/DL Final    Color, UA 04/24/2022 DARK YELLOW    Final    Appearance 04/24/2022 TURBID (A)  CLEAR   Final    Specific Gravity, UA 04/24/2022 1.025    Final    pH, Urine 04/24/2022 6.0  5.0 - 8.0   Final    Protein, UA 04/24/2022 30 (A)  NEG mg/dL Final    Glucose, UA 04/24/2022 Negative  NEG mg/dL Final    Ketones, Urine 04/24/2022 Negative  NEG mg/dL Final    Bilirubin Urine 04/24/2022 Negative  NEG   Final    Blood, Urine 04/24/2022 TRACE (A)  NEG   Final    Urobilinogen, Urine 04/24/2022 1.0  0.2 - 1.0 EU/dL Final    Nitrite, Urine 04/24/2022 Positive (A)  NEG   Final    Leukocyte Esterase, Urine 04/24/2022 LARGE (A)  NEG   Final    WBC, UA 04/24/2022 20-50  0 - 4 /hpf Final    RBC, UA 04/24/2022 0-5  0 - 5 /hpf Final     Epithelial Cells UA 04/24/2022 MODERATE (A)  FEW /lpf Final    BACTERIA, URINE 04/24/2022 2+ (A)  NEG /hpf Final    Amphetamine, Urine 04/24/2022 Negative  NEG   Final    Barbiturates, Urine 04/24/2022 Negative  NEG   Final    Benzodiazepines, Urine 04/24/2022 Negative  NEG   Final    Cocaine, Urine 04/24/2022 Negative  NEG   Final    Methadone, Urine 04/24/2022 Negative  NEG   Final    Opiates, Urine 04/24/2022 Negative  NEG   Final    PCP, Urine 04/24/2022 Negative  NEG   Final    THC, TH-Cannabinol, Urine 04/24/2022 Positive (A)  NEG   Final    Comments: 04/24/2022 (NOTE)   Final    Preg Test, Ur 04/24/2022  Negative  NEG   Final     No results found.                DISPOSITION:    Home. Patient to f/u with psychiatric and psychotherapy appointments. Patient is to f/u with all outpatient treatment as directed, including psychiatric, medical, and social appointments.               FOLLOW-UP CARE:    Follow-up Information       Follow up With Specialties Details Why Contact Info    [place] on 04/27/2022 [facility] is how you start services at [facility] and are assigned to your ongoing service provider. [facility] provides ongoing mental health and substance use treatment services.     You may choose to complete a phone screening first. You may call [phone] to start that process.     [facility] Hours     Monday - Friday, 8 am - 2 pm.      Location      [facility] services are available at the main [place] office located at [address] [place] [address].     What to Bring     Photo identification     Proof of [facility] residency (i.e. postmarked mail, utility bill, lease agreement)     Insurance card, if applicable     Proof of income (i.e. social services determination letter, W-2, paystub)     Medication bottles, if available     Additional documentation you may have regarding your needs (i.e. hospital discharge information, probation referral, other provider referral).  This information can also be faxed directly from providers to [phone]. [address] [address]  [phone]                  PROGNOSIS:   Poor---- based on nature of patient's pathology/ies and treatment compliance issues.  Prognosis is greatly dependent upon patient's ability to remain sober and to follow up with scheduled appointments as well as to comply with psychiatric medications as prescribed.          DISCHARGE MEDICATIONS: (no changes made).    Informed consent given for the use of following psychotropic medications:     Medication List        START taking these medications      nitrofurantoin (macrocrystal-monohydrate) 100 MG capsule  Commonly known as: MACROBID  Take 1 capsule by mouth every 12 hours for 10 doses            CONTINUE taking these medications      Claritin 10 MG tablet  Generic drug: loratadine               Where to Get Your Medications        Information about where to get these medications is not yet available    Ask your nurse or doctor about these medications  nitrofurantoin (macrocrystal-monohydrate) 100 MG capsule                A coordinated, multidisplinary treatment team round was conducted with Susan Young---this is done daily here at [facility]. This team consists of the nurse, psychiatric unit pharmacist, Catering manager.     I have spent greater than 35 minutes on discharge work.    Signed:  Raeford Razor, MD  04/26/2022
